# Patient Record
Sex: Male | Born: 1970 | Race: White | Hispanic: No | Marital: Married
Health system: Southern US, Community
[De-identification: ages and names within clinical notes are randomized; demographics above are authoritative.]

---

## 2004-12-24 ENCOUNTER — Ambulatory Visit: Payer: Self-pay | Admitting: Unknown Physician Specialty

## 2009-01-28 ENCOUNTER — Emergency Department: Payer: Self-pay | Admitting: Emergency Medicine

## 2009-01-30 ENCOUNTER — Ambulatory Visit: Payer: Self-pay | Admitting: Surgery

## 2009-02-07 ENCOUNTER — Ambulatory Visit: Payer: Self-pay | Admitting: Surgery

## 2009-02-20 ENCOUNTER — Ambulatory Visit: Payer: Self-pay | Admitting: Gastroenterology

## 2009-12-31 ENCOUNTER — Inpatient Hospital Stay (HOSPITAL_COMMUNITY): Admission: RE | Admit: 2009-12-31 | Discharge: 2010-01-03 | Payer: Self-pay | Admitting: Specialist

## 2010-04-22 LAB — CBC
HCT: 28.8 % — ABNORMAL LOW (ref 39.0–52.0)
HCT: 31.8 % — ABNORMAL LOW (ref 39.0–52.0)
HCT: 37.3 % — ABNORMAL LOW (ref 39.0–52.0)
HCT: 41.2 % (ref 39.0–52.0)
Hemoglobin: 11.1 g/dL — ABNORMAL LOW (ref 13.0–17.0)
Hemoglobin: 13.3 g/dL (ref 13.0–17.0)
Hemoglobin: 14.5 g/dL (ref 13.0–17.0)
MCH: 31.8 pg (ref 26.0–34.0)
MCHC: 34.8 g/dL (ref 30.0–36.0)
MCV: 90.5 fL (ref 78.0–100.0)
MCV: 90.6 fL (ref 78.0–100.0)
MCV: 91.9 fL (ref 78.0–100.0)
RBC: 3.46 MIL/uL — ABNORMAL LOW (ref 4.22–5.81)
RBC: 4.54 MIL/uL (ref 4.22–5.81)
RDW: 12.6 % (ref 11.5–15.5)
RDW: 12.9 % (ref 11.5–15.5)
WBC: 16.9 10*3/uL — ABNORMAL HIGH (ref 4.0–10.5)
WBC: 8.4 10*3/uL (ref 4.0–10.5)
WBC: 9.8 10*3/uL (ref 4.0–10.5)

## 2010-04-22 LAB — COMPREHENSIVE METABOLIC PANEL
ALT: 19 U/L (ref 0–53)
Alkaline Phosphatase: 79 U/L (ref 39–117)
CO2: 29 mEq/L (ref 19–32)
Chloride: 104 mEq/L (ref 96–112)
Glucose, Bld: 101 mg/dL — ABNORMAL HIGH (ref 70–99)
Potassium: 4.2 mEq/L (ref 3.5–5.1)
Sodium: 141 mEq/L (ref 135–145)
Total Bilirubin: 0.6 mg/dL (ref 0.3–1.2)
Total Protein: 6.7 g/dL (ref 6.0–8.3)

## 2010-04-22 LAB — URINALYSIS, ROUTINE W REFLEX MICROSCOPIC
Bilirubin Urine: NEGATIVE
Glucose, UA: NEGATIVE mg/dL
Hgb urine dipstick: NEGATIVE
Ketones, ur: NEGATIVE mg/dL
Specific Gravity, Urine: 1.016 (ref 1.005–1.030)
pH: 7.5 (ref 5.0–8.0)

## 2010-04-22 LAB — CROSSMATCH

## 2010-04-22 LAB — BASIC METABOLIC PANEL
CO2: 32 mEq/L (ref 19–32)
Chloride: 100 mEq/L (ref 96–112)
Chloride: 102 mEq/L (ref 96–112)
GFR calc Af Amer: >60 mL/min (ref >60)
GFR calc non Af Amer: >60 mL/min (ref >60)
Potassium: 3.5 mEq/L (ref 3.5–5.1)
Potassium: 5 mEq/L (ref 3.5–5.1)
Sodium: 138 mEq/L (ref 135–145)
Sodium: 141 mEq/L (ref 135–145)

## 2010-04-22 LAB — DIFFERENTIAL
Basophils Absolute: 0 10*3/uL (ref 0.0–0.1)
Basophils Relative: 0 % (ref 0–1)
Eosinophils Absolute: 0.1 10*3/uL (ref 0.0–0.7)
Monocytes Relative: 7 % (ref 3–12)
Neutrophils Relative %: 68 % (ref 43–77)

## 2010-04-22 LAB — PROTIME-INR: INR: 0.93 (ref 0.00–1.49)

## 2010-04-22 LAB — SURGICAL PCR SCREEN: MRSA, PCR: NEGATIVE

## 2010-04-22 LAB — ABO/RH: ABO/RH(D): A NEG

## 2010-11-16 IMAGING — NM NUCLEAR MEDICINE HEPATOHBILIARY INCLUDE GB
2 series · 12 of 12 positions shown · non-contrast
Comparison: none

REASON FOR EXAM: abdominal pain epigastric
COMMENTS:

[Series 1000: gallbladder dynamic · 4.80mm/px · 6 of 60 frames shown]
[frame 6/60]
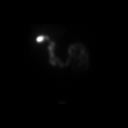
[frame 16/60]
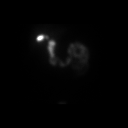
[frame 26/60]
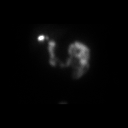
[frame 36/60]
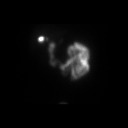
[frame 46/60]
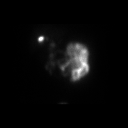
[frame 56/60]
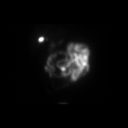

[Series 1000: gallbladder dynamic (results) · 4.80mm/px · 6 of 60 frames shown]
[frame 6/60]
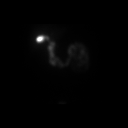
[frame 16/60]
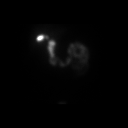
[frame 26/60]
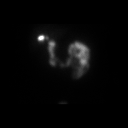
[frame 36/60]
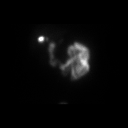
[frame 46/60]
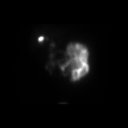
[frame 56/60]
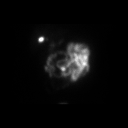

[12 of 12 positions shown; findings below may reference images not displayed]

PROCEDURE:     NM  - NM HEPATO WITH GB EJECT FRACTION  - February 07, 2009  [DATE]

RESULT:     Following intravenous administration of 7.82 mCi technetium 99m
Choletec, there is noted prompt visualization of tracer activity in the
liver at 3 minutes. Tracer activity is visualized in the gallbladder, common
duct and proximal small bowel at 40 minutes.

The gallbladder ejection fraction at 30 minutes measures 79% which is in the
normal range.
IMPRESSION: 1. Normal hepatobiliary scan.
2. The gallbladder ejection fraction measures 79% which is in the normal
range.

## 2016-11-24 ENCOUNTER — Emergency Department
Admission: EM | Admit: 2016-11-24 | Discharge: 2016-11-24 | Disposition: A | Discharge status: Home / Self Care | Payer: Non-veteran care | Attending: Emergency Medicine | Admitting: Emergency Medicine

## 2016-11-24 DIAGNOSIS — Y999 Unspecified external cause status: Secondary | ICD-10-CM | POA: Insufficient documentation

## 2016-11-24 DIAGNOSIS — Y9289 Other specified places as the place of occurrence of the external cause: Secondary | ICD-10-CM | POA: Insufficient documentation

## 2016-11-24 DIAGNOSIS — W3183XA Contact with special construction vehicle in stationary use, initial encounter: Secondary | ICD-10-CM | POA: Insufficient documentation

## 2016-11-24 DIAGNOSIS — Y9389 Activity, other specified: Secondary | ICD-10-CM | POA: Diagnosis not present

## 2016-11-24 DIAGNOSIS — S51812A Laceration without foreign body of left forearm, initial encounter: Secondary | ICD-10-CM | POA: Diagnosis present

## 2016-11-24 MED ORDER — SULFAMETHOXAZOLE-TRIMETHOPRIM 800-160 MG PO TABS: 1.0000 | ORAL_TABLET | Freq: Two times a day (BID) | ORAL | 0 refills | Status: AC

## 2016-11-24 MED ORDER — LIDOCAINE HCL (PF) 1 % IJ SOLN
5.0000 mL | Freq: Once | INTRAMUSCULAR | Status: AC
  Administered 2016-11-24: 5 mL
  Filled 2016-11-24: qty 5

## 2016-11-24 MED ORDER — TETANUS-DIPHTH-ACELL PERTUSSIS 5-2.5-18.5 LF-MCG/0.5 IM SUSP: 0.5000 mL | Freq: Once | INTRAMUSCULAR | Status: DC

## 2016-11-24 NOTE — Discharge Instructions (Signed)
All of the stitches have been placed underneath the skin and will not be removed. The Steri-Strips will eventually fall off. Keep wound area clean and dry and covered and dusty dirty and wet environments. Monitor the wound area for signs of healing if you note signs of developing infection do not hesitate to follow up with her primary care or return to the emergency department.  Avoid heavy lifting with the left upper extremity for the next 24-48 hours if possible to allow the wound skin margins to heal.

## 2016-11-24 NOTE — ED Triage Notes (Signed)
Pt states he tripped and fell cutting his arm on a rusty piece of metal on his left FA. Bleeding controlled.

## 2016-11-24 NOTE — ED Provider Notes (Signed)
[facility] Emergency Department Provider Note   ____________________________________________   I have reviewed the triage vital signs and the nursing notes.   HISTORY  Chief Complaint Laceration    HPI Eric Jenkins is a 46 y.o. male presents to the emergency department with laceration along the left forearm. Patient reports removing metal debris when he slipped and fell across a rusty piece of fender of a tractor sustaining the laceration. Patient reports maintaining hemorrhage control as soon as he sustained the injury. Patient reports having an updated tetanus Patient has a recent history of staph infection where he developed sepsis. Patient continues to take amoxicillin 3 times a day orally. Patient denies any constitutional symptoms at this time. Patient demonstrates intact pulses, movement sensation of the left hand wrist and fingers since the time of the injury. Patient denies fever, chills, headache, vision changes, chest pain, chest tightness, shortness of breath, abdominal pain, nausea and vomiting.  No past medical history on file.  There are no active problems to display for this patient.   No past surgical history on file.  Prior to Admission medications   Medication Sig Start Date End Date Taking? Authorizing Provider  sulfamethoxazole-trimethoprim (BACTRIM DS,SEPTRA DS) 800-160 MG tablet Take 1 tablet by mouth 2 (two) times daily. 11/24/16 11/29/16  Bindu Docter M, PA-C    Allergies Patient has no known allergies.  No family history on file.  Social History Social History  Substance Use Topics  . Smoking status: Not on file  . Smokeless tobacco: Not on file  . Alcohol use Not on file    Review of Systems Constitutional: Negative for fever/chills Cardiovascular: Denies chest pain. Respiratory: Denies cough. Denies shortness of breath. Gastrointestinal: No abdominal pain.  No nausea, vomiting, diarrhea. Skin: Negative for  rash. Laceration along the left forearm. Neurological: Negative for headaches.  ____________________________________________   PHYSICAL EXAM:  VITAL SIGNS: ED Triage Vitals [11/24/16 1532]  Enc Vitals Group     BP 131/78     Pulse Rate 82     Resp 18     Temp 98.1 F (36.7 C)     Temp Source Oral     SpO2 95 %     Weight 222 lb (100.7 kg)     Height  (1.702 m)     Head Circumference      Peak Flow      Pain Score      Pain Loc      Pain Edu?      Excl. in GC?     Constitutional: Alert and oriented. Well appearing and in no acute distress.  Eyes: Conjunctivae are normal. PERRL. EOMI  Head: Normocephalic and atraumatic. Cardiovascular: Normal rate, regular rhythm. Respiratory: Normal respiratory effort without tachypnea or retractions.  Musculoskeletal: Nontender with normal range of motion in all extremities. Neurologic: Normal speech and language. Skin:  Skin is warm, dry and intact. No rash noted. Laceration along the left forearm. No tenderness or muscle involvement. Approximately 5 cm x 1.0 cm gapping. Psychiatric: Mood and affect are normal. Speech and behavior are normal. Patient exhibits appropriate insight and judgement.  ____________________________________________   LABS (all labs ordered are listed, but only abnormal results are displayed)  Labs Reviewed - No data to display ____________________________________________  EKG none ____________________________________________  RADIOLOGY none ____________________________________________   PROCEDURES  Procedure(s) performed: LACERATION REPAIR Performed by: Clois Comber Authorized by: Clois Comber Consent: Verbal consent obtained. Risks and benefits: risks, benefits and  alternatives were discussed Consent given by: patient Patient identity confirmed: provided demographic data Prepped and Draped in normal sterile fashion Wound explored  Laceration Location: left forearm  Laceration  Length: cm  Small rust particles visualized not palpated.  Anesthesia: local infiltration  Local anesthetic: lidocaine 1%  Anesthetic total: 8.0 ml  Irrigation method: syringe, gauze Amount of cleaning: standard  Skin closure: Monocryl 5-0  Number of sutures: 10 suture; 2 subcutaneous, 8 dermal  Technique: simple interrupted  Followed by application of steri-stips  Patient tolerance: Patient tolerated the procedure well with no immediate complications.   Critical Care performed: no ____________________________________________   INITIAL IMPRESSION / ASSESSMENT AND PLAN / ED COURSE  Pertinent labs & imaging results that were available during my care of the patient were reviewed by me and considered in my medical decision making (see chart for details).  Patient sustained a laceration along left forearm.  Assessment confirmed movement and sensation along left forearm, wrist, hand and fingers  before and after wound closure. Laceration required suture closure as noted above. Patient tolerated procedure well. Pt instructed to keep wound clean and dry and will return to the emergency department or PCP if he noted signs of developing infection. Patient also instructed to watch for signs of infection and return if changes are noted. Patient informed of clinical course, understand medical decision-making process, and agree with plan.      ____________________________________________   FINAL CLINICAL IMPRESSION(S) / ED DIAGNOSES  Final diagnoses:  Forearm laceration, left, initial encounter       NEW MEDICATIONS STARTED DURING THIS VISIT:  Discharge Medication List as of 11/24/2016  5:15 PM    START taking these medications   Details  sulfamethoxazole-trimethoprim (BACTRIM DS,SEPTRA DS) 800-160 MG tablet Take 1 tablet by mouth 2 (two) times daily., Starting Tue 11/24/2016, Until Sun 11/29/2016, Print         Note:  This document was prepared using Dragon voice  recognition software and may include unintentional dictation errors.   Clois Comber, PA-C 11/24/16 2057    Arnaldo Natal, MD 12/05/16 [phone]
